# Patient Record
Sex: Female | Born: 2000 | Race: Black or African American | Hispanic: No | Marital: Single | Smoking: Never smoker
Health system: Southern US, Community
[De-identification: ages and names within clinical notes are randomized; demographics above are authoritative.]

## PROBLEM LIST (undated history)

## (undated) DIAGNOSIS — L709 Acne, unspecified: Secondary | ICD-10-CM

## (undated) HISTORY — PX: DENTAL SURGERY: SHX609

---
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
## 2022-06-29 IMAGING — MR MR KNEE*L* W/O CM
7 series · 40 of 40 positions shown · non-contrast
Comparison: None.

CLINICAL DATA: Anterior knee pain, basketball injury 05/30/2020

EXAM:
MRI OF THE LEFT KNEE WITHOUT CONTRAST
TECHNIQUE: Multiplanar, multisequence MR imaging of the knee was performed. No
intravenous contrast was administered.

[Series 8: T2 fat-sat · axial · left · 4.0mm · 0.44mm/px · z∈[-66,+59]mm · 5 of 26 slices shown (1 of 3)]
[im 1/26]
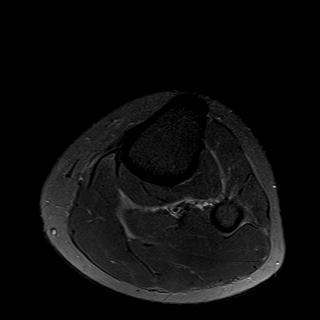
[im 7/26]
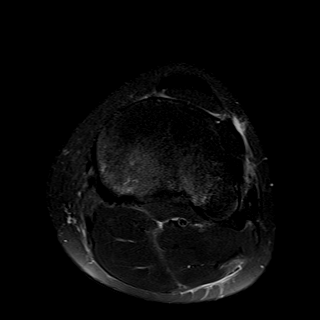
[im 13/26]
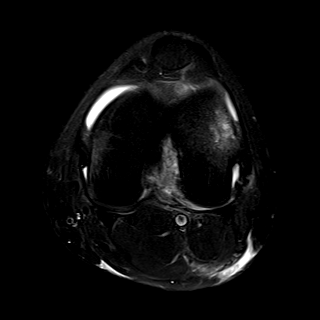
[im 19/26]
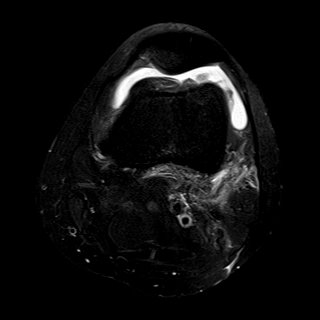
[im 26/26]
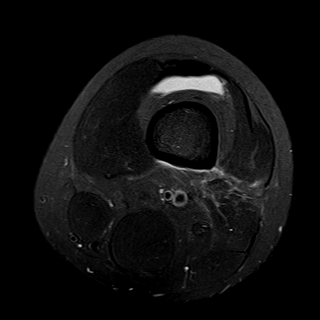

[Series 9: T2 fat-sat · coronal · left · 4.0mm · 0.59mm/px · 6 of 26 slices shown (2 of 3)]
[im 1/26]
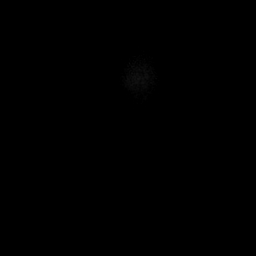
[im 6/26]
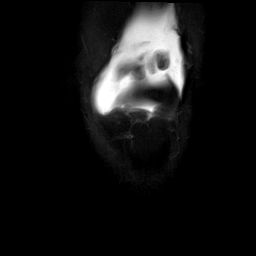
[im 11/26]
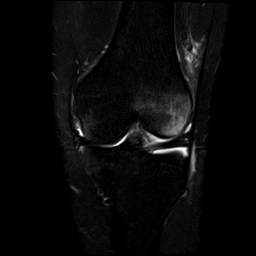
[im 16/26]
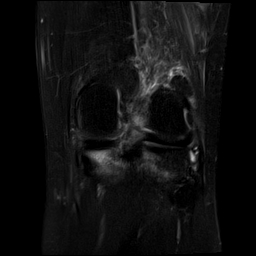
[im 21/26]
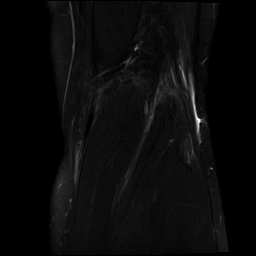
[im 26/26]
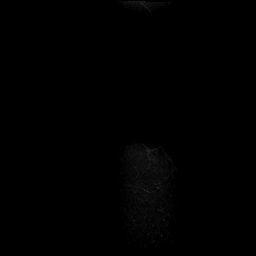

[Series 10: T1 · coronal · left · 4.0mm · 0.59mm/px · 6 of 26 slices shown]
[im 1/26]
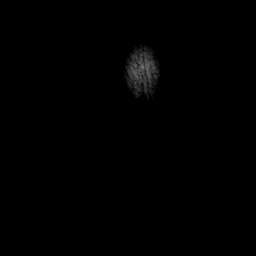
[im 6/26]
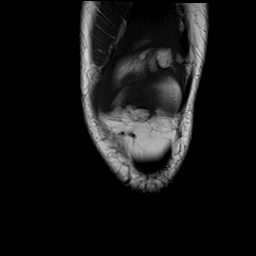
[im 11/26]
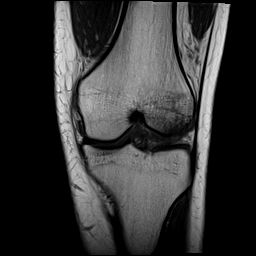
[im 16/26]
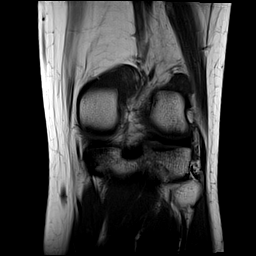
[im 21/26]
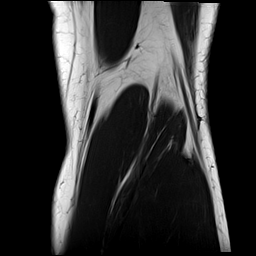
[im 26/26]
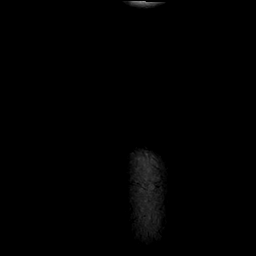

[Series 11: PD fat-sat · coronal · left · 4.0mm · 0.59mm/px · 6 of 26 slices shown (1 of 2)]
[im 1/26]
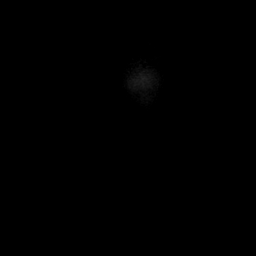
[im 6/26]
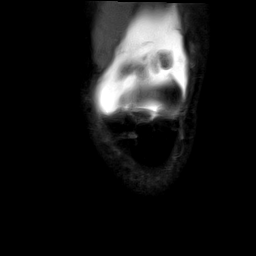
[im 11/26]
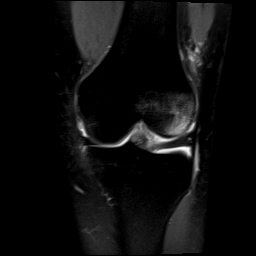
[im 16/26]
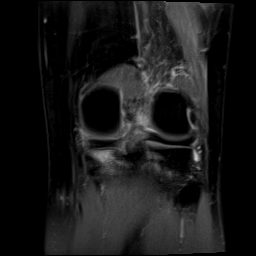
[im 21/26]
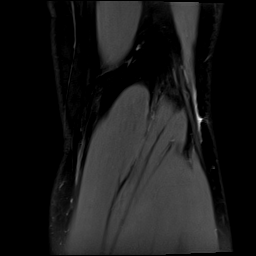
[im 26/26]
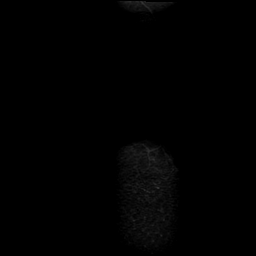

[Series 12: PD fat-sat · sagittal · left · 3.0mm · 0.59mm/px · 7 of 31 slices shown (2 of 2)]
[im 1/31]
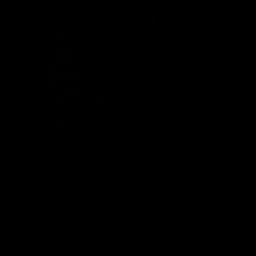
[im 6/31]
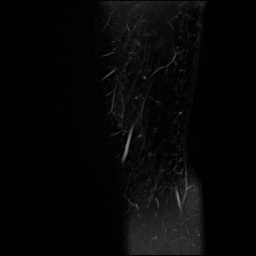
[im 11/31]
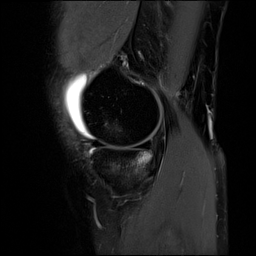
[im 16/31]
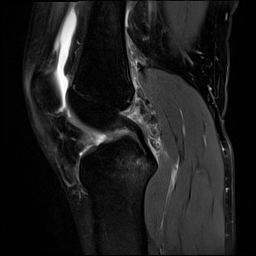
[im 21/31]
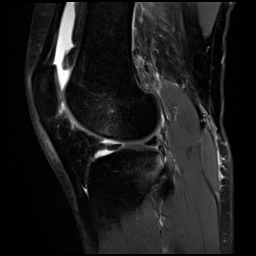
[im 26/31]
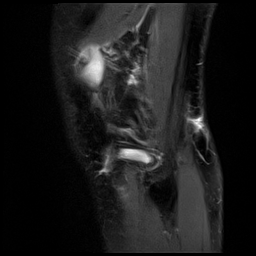
[im 31/31]
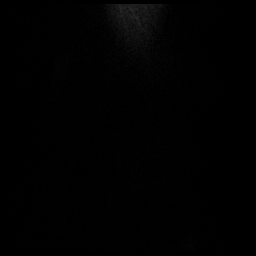

[Series 13: T2 fat-sat · sagittal · left · 3.0mm · 0.59mm/px · 7 of 32 slices shown (3 of 3)]
[im 1/32]
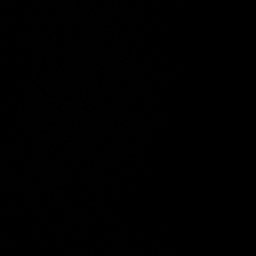
[im 6/32]
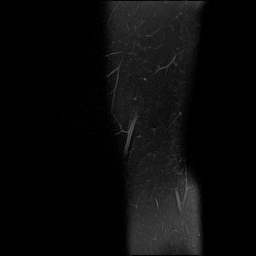
[im 11/32]
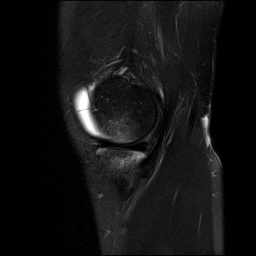
[im 16/32]
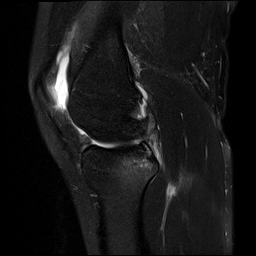
[im 21/32]
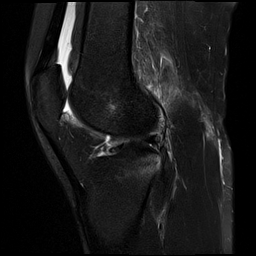
[im 26/32]
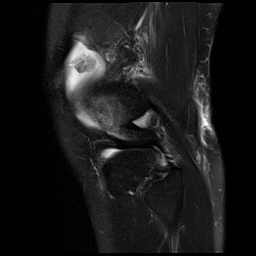
[im 32/32]
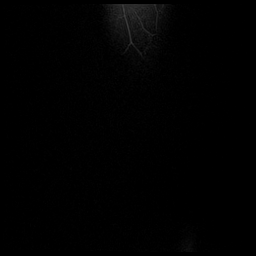

[Series 14: PD · oblique · left · 2.0mm · 0.47mm/px · 3 of 16 slices shown]
[im 1/16]
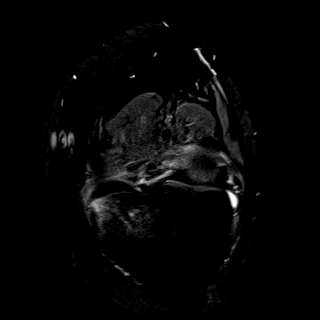
[im 8/16]
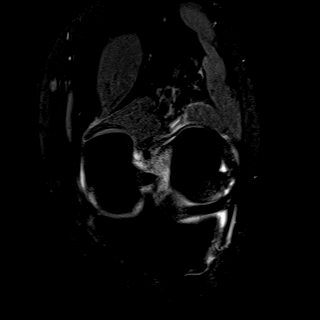
[im 16/16]
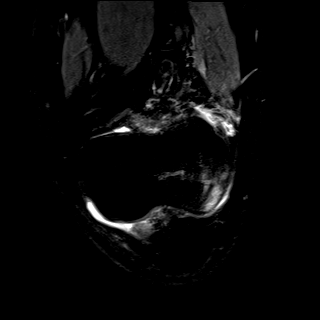

[40 of 40 positions shown; findings below may reference images not displayed]

FINDINGS: MENISCI

Medial: Partial attenuation of the root of the posterior horn of the
medial meniscus which may reflect a partial radial tear.

Lateral: Intact.

LIGAMENTS

Cruciates: Complete ACL tear.  Intact PCL.

Collaterals: Medial collateral ligament is intact. Lateral
collateral ligament complex is intact.

CARTILAGE

Patellofemoral:  No chondral defect.

Medial:  No chondral defect.

Lateral:  No chondral defect.

JOINT: Large joint effusion. Normal Anab Berkowitz. No plical
thickening. Joint capsule is intact.

POPLITEAL FOSSA: Popliteus tendon is intact. No Baker's cyst.

EXTENSOR MECHANISM: Intact quadriceps tendon. Intact patellar
tendon. Intact lateral patellar retinaculum. Intact medial patellar
retinaculum. Intact MPFL.

BONES: No aggressive osseous lesion. No fracture or dislocation.
Osseous contusions of the posterolateral tibial plateau,
anterolateral femoral condyle and posteromedial tibial plateau.

Other: No fluid collection or hematoma. Muscles are normal. Mild
soft tissue edema superficial to the lateral gastrocnemius muscle.
IMPRESSION: 1. Complete ACL tear.
2. Partial attenuation of the root of the posterior horn of the
medial meniscus which may reflect a partial radial tear.
3. Osseous contusions of the posterolateral tibial plateau,
anterolateral femoral condyle and posteromedial tibial plateau.
4. Large joint effusion.
